# Patient Record
Sex: Female | Born: 1955 | Race: White | Hispanic: No | Marital: Married | Smoking: Never smoker
Health system: Southern US, Community
[De-identification: ages and names within clinical notes are randomized; demographics above are authoritative.]

## PROBLEM LIST (undated history)

## (undated) DIAGNOSIS — C801 Malignant (primary) neoplasm, unspecified: Secondary | ICD-10-CM

## (undated) DIAGNOSIS — E079 Disorder of thyroid, unspecified: Secondary | ICD-10-CM

## (undated) DIAGNOSIS — C50912 Malignant neoplasm of unspecified site of left female breast: Secondary | ICD-10-CM

## (undated) DIAGNOSIS — F419 Anxiety disorder, unspecified: Secondary | ICD-10-CM

## (undated) HISTORY — DX: Disorder of thyroid, unspecified: E07.9

## (undated) HISTORY — DX: Anxiety disorder, unspecified: F41.9

## (undated) HISTORY — DX: Malignant neoplasm of unspecified site of left female breast: C50.912

## (undated) HISTORY — PX: TONSILLECTOMY: SUR1361

---

## 2008-11-21 ENCOUNTER — Encounter: Admission: RE | Admit: 2008-11-21 | Discharge: 2008-11-21 | Payer: Self-pay | Admitting: Gynecology

## 2008-11-21 ENCOUNTER — Encounter (INDEPENDENT_AMBULATORY_CARE_PROVIDER_SITE_OTHER): Payer: Self-pay | Admitting: Diagnostic Radiology

## 2008-11-21 ENCOUNTER — Encounter (INDEPENDENT_AMBULATORY_CARE_PROVIDER_SITE_OTHER): Payer: Self-pay | Admitting: Gynecology

## 2008-11-27 ENCOUNTER — Encounter: Admission: RE | Admit: 2008-11-27 | Discharge: 2008-11-27 | Payer: Self-pay | Admitting: Gynecology

## 2008-11-28 ENCOUNTER — Ambulatory Visit: Payer: Self-pay | Admitting: Oncology

## 2008-11-29 LAB — CBC WITH DIFFERENTIAL/PLATELET
Basophils Absolute: 0 10*3/uL (ref 0.0–0.1)
Eosinophils Absolute: 0.1 10*3/uL (ref 0.0–0.5)
HGB: 13.8 g/dL (ref 11.6–15.9)
MCV: 83.6 fL (ref 79.5–101.0)
MONO#: 0.4 10*3/uL (ref 0.1–0.9)
MONO%: 6.3 % (ref 0.0–14.0)
NEUT#: 3.7 10*3/uL (ref 1.5–6.5)
RBC: 4.91 10*6/uL (ref 3.70–5.45)
RDW: 14.2 % (ref 11.2–14.5)
WBC: 6.3 10*3/uL (ref 3.9–10.3)
lymph#: 2 10*3/uL (ref 0.9–3.3)

## 2008-11-29 LAB — COMPREHENSIVE METABOLIC PANEL
Albumin: 4.1 g/dL (ref 3.5–5.2)
Alkaline Phosphatase: 65 U/L (ref 39–117)
BUN: 10 mg/dL (ref 6–23)
Calcium: 9.5 mg/dL (ref 8.4–10.5)
Chloride: 108 mEq/L (ref 96–112)
Glucose, Bld: 91 mg/dL (ref 70–99)
Potassium: 4.2 mEq/L (ref 3.5–5.3)
Sodium: 140 mEq/L (ref 135–145)
Total Protein: 7 g/dL (ref 6.0–8.3)

## 2008-11-30 LAB — CANCER ANTIGEN 27.29: CA 27.29: 19 U/mL (ref 0–39)

## 2008-12-05 ENCOUNTER — Ambulatory Visit (HOSPITAL_COMMUNITY): Admission: RE | Admit: 2008-12-05 | Discharge: 2008-12-05 | Payer: Self-pay | Admitting: Oncology

## 2008-12-06 ENCOUNTER — Ambulatory Visit (HOSPITAL_BASED_OUTPATIENT_CLINIC_OR_DEPARTMENT_OTHER): Admission: RE | Admit: 2008-12-06 | Discharge: 2008-12-06 | Payer: Self-pay | Admitting: Surgery

## 2008-12-06 ENCOUNTER — Encounter (INDEPENDENT_AMBULATORY_CARE_PROVIDER_SITE_OTHER): Payer: Self-pay | Admitting: Surgery

## 2008-12-08 ENCOUNTER — Encounter: Payer: Self-pay | Admitting: Oncology

## 2008-12-08 ENCOUNTER — Ambulatory Visit: Payer: Self-pay

## 2008-12-20 LAB — CBC WITH DIFFERENTIAL/PLATELET
BASO%: 0.3 % (ref 0.0–2.0)
Basophils Absolute: 0 10*3/uL (ref 0.0–0.1)
EOS%: 2.3 % (ref 0.0–7.0)
HGB: 12.7 g/dL (ref 11.6–15.9)
MCH: 28 pg (ref 25.1–34.0)
MCHC: 33.5 g/dL (ref 31.5–36.0)
MCV: 83.8 fL (ref 79.5–101.0)
MONO%: 4.1 % (ref 0.0–14.0)
RBC: 4.54 10*6/uL (ref 3.70–5.45)
RDW: 13.7 % (ref 11.2–14.5)
lymph#: 1.5 10*3/uL (ref 0.9–3.3)

## 2008-12-25 ENCOUNTER — Ambulatory Visit: Payer: Self-pay | Admitting: Oncology

## 2008-12-26 LAB — CBC WITH DIFFERENTIAL/PLATELET
Basophils Absolute: 0 10*3/uL (ref 0.0–0.1)
Eosinophils Absolute: 0.1 10*3/uL (ref 0.0–0.5)
HGB: 13.1 g/dL (ref 11.6–15.9)
MONO#: 0.9 10*3/uL (ref 0.1–0.9)
MONO%: 7.9 % (ref 0.0–14.0)
NEUT#: 7.5 10*3/uL — ABNORMAL HIGH (ref 1.5–6.5)
RBC: 4.78 10*6/uL (ref 3.70–5.45)
RDW: 13.6 % (ref 11.2–14.5)
WBC: 10.7 10*3/uL — ABNORMAL HIGH (ref 3.9–10.3)
lymph#: 2.3 10*3/uL (ref 0.9–3.3)
nRBC: 0 % (ref 0–0)

## 2009-01-02 LAB — CBC WITH DIFFERENTIAL/PLATELET
BASO%: 0.1 % (ref 0.0–2.0)
Eosinophils Absolute: 0 10*3/uL (ref 0.0–0.5)
HCT: 39.3 % (ref 34.8–46.6)
LYMPH%: 21.8 % (ref 14.0–49.7)
MCHC: 33.8 g/dL (ref 31.5–36.0)
MONO#: 0.2 10*3/uL (ref 0.1–0.9)
NEUT#: 6 10*3/uL (ref 1.5–6.5)
NEUT%: 74.8 % (ref 38.4–76.8)
Platelets: 175 10*3/uL (ref 145–400)
WBC: 8 10*3/uL (ref 3.9–10.3)
lymph#: 1.7 10*3/uL (ref 0.9–3.3)
nRBC: 0 % (ref 0–0)

## 2009-01-09 LAB — CBC WITH DIFFERENTIAL/PLATELET
Basophils Absolute: 0.1 10*3/uL (ref 0.0–0.1)
Eosinophils Absolute: 0.1 10*3/uL (ref 0.0–0.5)
HCT: 35.9 % (ref 34.8–46.6)
HGB: 12.1 g/dL (ref 11.6–15.9)
MCV: 81.2 fL (ref 79.5–101.0)
NEUT#: 7.5 10*3/uL — ABNORMAL HIGH (ref 1.5–6.5)
NEUT%: 68 % (ref 38.4–76.8)
RDW: 13.9 % (ref 11.2–14.5)
lymph#: 2.3 10*3/uL (ref 0.9–3.3)

## 2009-01-16 LAB — CBC WITH DIFFERENTIAL/PLATELET
Basophils Absolute: 0 10*3/uL (ref 0.0–0.1)
EOS%: 0.3 % (ref 0.0–7.0)
Eosinophils Absolute: 0 10*3/uL (ref 0.0–0.5)
HCT: 36.9 % (ref 34.8–46.6)
HGB: 12.5 g/dL (ref 11.6–15.9)
MCH: 27.5 pg (ref 25.1–34.0)
MONO#: 0.2 10*3/uL (ref 0.1–0.9)
NEUT#: 7.9 10*3/uL — ABNORMAL HIGH (ref 1.5–6.5)
NEUT%: 83.8 % — ABNORMAL HIGH (ref 38.4–76.8)
RDW: 13.6 % (ref 11.2–14.5)
lymph#: 1.3 10*3/uL (ref 0.9–3.3)

## 2009-01-23 ENCOUNTER — Ambulatory Visit: Payer: Self-pay | Admitting: Oncology

## 2009-01-23 LAB — CBC WITH DIFFERENTIAL/PLATELET
Basophils Absolute: 0 10*3/uL (ref 0.0–0.1)
EOS%: 1.1 % (ref 0.0–7.0)
Eosinophils Absolute: 0.1 10*3/uL (ref 0.0–0.5)
HCT: 34.6 % — ABNORMAL LOW (ref 34.8–46.6)
HGB: 11.7 g/dL (ref 11.6–15.9)
MCH: 27.7 pg (ref 25.1–34.0)
MCV: 81.8 fL (ref 79.5–101.0)
MONO%: 13 % (ref 0.0–14.0)
NEUT#: 5.9 10*3/uL (ref 1.5–6.5)
NEUT%: 64.9 % (ref 38.4–76.8)
Platelets: 248 10*3/uL (ref 145–400)
RDW: 14.7 % — ABNORMAL HIGH (ref 11.2–14.5)

## 2009-01-24 ENCOUNTER — Encounter: Payer: Self-pay | Admitting: Oncology

## 2009-01-24 ENCOUNTER — Ambulatory Visit (HOSPITAL_COMMUNITY): Admission: RE | Admit: 2009-01-24 | Discharge: 2009-01-24 | Payer: Self-pay | Admitting: Oncology

## 2009-01-24 ENCOUNTER — Ambulatory Visit: Payer: Self-pay | Admitting: Cardiology

## 2009-01-24 ENCOUNTER — Ambulatory Visit: Admission: RE | Admit: 2009-01-24 | Discharge: 2009-01-24 | Payer: Self-pay | Admitting: Oncology

## 2009-01-31 LAB — CBC WITH DIFFERENTIAL/PLATELET
Basophils Absolute: 0 10*3/uL (ref 0.0–0.1)
EOS%: 1.4 % (ref 0.0–7.0)
Eosinophils Absolute: 0.1 10*3/uL (ref 0.0–0.5)
LYMPH%: 22.6 % (ref 14.0–49.7)
MCH: 28.3 pg (ref 25.1–34.0)
MCV: 82.6 fL (ref 79.5–101.0)
MONO%: 9.1 % (ref 0.0–14.0)
NEUT#: 3.3 10*3/uL (ref 1.5–6.5)
Platelets: 252 10*3/uL (ref 145–400)
RBC: 4.16 10*6/uL (ref 3.70–5.45)

## 2009-01-31 LAB — COMPREHENSIVE METABOLIC PANEL
AST: 11 U/L (ref 0–37)
Alkaline Phosphatase: 94 U/L (ref 39–117)
BUN: 15 mg/dL (ref 6–23)
Glucose, Bld: 100 mg/dL — ABNORMAL HIGH (ref 70–99)
Sodium: 139 mEq/L (ref 135–145)
Total Bilirubin: 0.4 mg/dL (ref 0.3–1.2)

## 2009-02-06 LAB — CBC WITH DIFFERENTIAL/PLATELET
Basophils Absolute: 0 10*3/uL (ref 0.0–0.1)
Eosinophils Absolute: 0.1 10*3/uL (ref 0.0–0.5)
HGB: 12.3 g/dL (ref 11.6–15.9)
LYMPH%: 22.5 % (ref 14.0–49.7)
MCV: 83.2 fL (ref 79.5–101.0)
MONO%: 9 % (ref 0.0–14.0)
NEUT#: 6.2 10*3/uL (ref 1.5–6.5)
NEUT%: 66.8 % (ref 38.4–76.8)
Platelets: 263 10*3/uL (ref 145–400)

## 2009-02-06 LAB — TSH: TSH: 0.903 u[IU]/mL (ref 0.350–4.500)

## 2009-02-13 LAB — COMPREHENSIVE METABOLIC PANEL
ALT: 28 U/L (ref 0–35)
AST: 19 U/L (ref 0–37)
Albumin: 3.7 g/dL (ref 3.5–5.2)
CO2: 26 mEq/L (ref 19–32)
Calcium: 8.8 mg/dL (ref 8.4–10.5)
Chloride: 105 mEq/L (ref 96–112)
Potassium: 3.8 mEq/L (ref 3.5–5.3)
Sodium: 136 mEq/L (ref 135–145)
Total Protein: 6.3 g/dL (ref 6.0–8.3)

## 2009-02-13 LAB — CBC WITH DIFFERENTIAL/PLATELET
BASO%: 0.5 % (ref 0.0–2.0)
EOS%: 2.7 % (ref 0.0–7.0)
MCH: 28.1 pg (ref 25.1–34.0)
MCHC: 33.1 g/dL (ref 31.5–36.0)
MONO#: 0.4 10*3/uL (ref 0.1–0.9)
RBC: 4.13 10*6/uL (ref 3.70–5.45)
WBC: 6 10*3/uL (ref 3.9–10.3)
lymph#: 1.5 10*3/uL (ref 0.9–3.3)

## 2009-02-20 LAB — CBC WITH DIFFERENTIAL/PLATELET
BASO%: 0.4 % (ref 0.0–2.0)
Basophils Absolute: 0 10*3/uL (ref 0.0–0.1)
EOS%: 3.5 % (ref 0.0–7.0)
HCT: 31.2 % — ABNORMAL LOW (ref 34.8–46.6)
HGB: 10.3 g/dL — ABNORMAL LOW (ref 11.6–15.9)
MCH: 28.1 pg (ref 25.1–34.0)
MCHC: 33 g/dL (ref 31.5–36.0)
MONO#: 0.3 10*3/uL (ref 0.1–0.9)
NEUT%: 66.1 % (ref 38.4–76.8)
RDW: 16 % — ABNORMAL HIGH (ref 11.2–14.5)
WBC: 4.6 10*3/uL (ref 3.9–10.3)
lymph#: 1.1 10*3/uL (ref 0.9–3.3)

## 2009-02-23 ENCOUNTER — Ambulatory Visit: Payer: Self-pay | Admitting: Oncology

## 2009-02-27 LAB — CBC WITH DIFFERENTIAL/PLATELET
Basophils Absolute: 0 10*3/uL (ref 0.0–0.1)
Eosinophils Absolute: 0.3 10*3/uL (ref 0.0–0.5)
HGB: 10.2 g/dL — ABNORMAL LOW (ref 11.6–15.9)
MCV: 86.7 fL (ref 79.5–101.0)
MONO#: 0.3 10*3/uL (ref 0.1–0.9)
NEUT#: 3 10*3/uL (ref 1.5–6.5)
RBC: 3.68 10*6/uL — ABNORMAL LOW (ref 3.70–5.45)
RDW: 16.1 % — ABNORMAL HIGH (ref 11.2–14.5)
WBC: 4.7 10*3/uL (ref 3.9–10.3)

## 2009-03-06 LAB — CBC WITH DIFFERENTIAL/PLATELET
BASO%: 0.2 % (ref 0.0–2.0)
Eosinophils Absolute: 0.2 10*3/uL (ref 0.0–0.5)
HCT: 33.9 % — ABNORMAL LOW (ref 34.8–46.6)
LYMPH%: 23.4 % (ref 14.0–49.7)
MCHC: 32.4 g/dL (ref 31.5–36.0)
MONO#: 0.4 10*3/uL (ref 0.1–0.9)
NEUT#: 3.9 10*3/uL (ref 1.5–6.5)
Platelets: 258 10*3/uL (ref 145–400)
RBC: 3.84 10*6/uL (ref 3.70–5.45)
WBC: 5.8 10*3/uL (ref 3.9–10.3)
lymph#: 1.4 10*3/uL (ref 0.9–3.3)
nRBC: 0 % (ref 0–0)

## 2009-03-13 LAB — CBC WITH DIFFERENTIAL/PLATELET
BASO%: 0.4 % (ref 0.0–2.0)
EOS%: 3.2 % (ref 0.0–7.0)
HCT: 35 % (ref 34.8–46.6)
LYMPH%: 21 % (ref 14.0–49.7)
MCH: 28.8 pg (ref 25.1–34.0)
MCHC: 32.3 g/dL (ref 31.5–36.0)
MCV: 89.3 fL (ref 79.5–101.0)
MONO%: 5.1 % (ref 0.0–14.0)
NEUT%: 70.3 % (ref 38.4–76.8)
Platelets: 244 10*3/uL (ref 145–400)
RBC: 3.92 10*6/uL (ref 3.70–5.45)

## 2009-03-20 LAB — CBC WITH DIFFERENTIAL/PLATELET
BASO%: 0.4 % (ref 0.0–2.0)
EOS%: 3.1 % (ref 0.0–7.0)
HCT: 35.5 % (ref 34.8–46.6)
MCH: 29 pg (ref 25.1–34.0)
MCHC: 32.4 g/dL (ref 31.5–36.0)
NEUT%: 63.6 % (ref 38.4–76.8)
RBC: 3.96 10*6/uL (ref 3.70–5.45)
RDW: 15.4 % — ABNORMAL HIGH (ref 11.2–14.5)
WBC: 4.9 10*3/uL (ref 3.9–10.3)
lymph#: 1.3 10*3/uL (ref 0.9–3.3)
nRBC: 0 % (ref 0–0)

## 2009-03-23 ENCOUNTER — Ambulatory Visit: Payer: Self-pay | Admitting: Oncology

## 2009-03-27 LAB — COMPREHENSIVE METABOLIC PANEL
ALT: 33 U/L (ref 0–35)
Alkaline Phosphatase: 55 U/L (ref 39–117)
Potassium: 3.9 mEq/L (ref 3.5–5.3)
Sodium: 140 mEq/L (ref 135–145)
Total Bilirubin: 0.5 mg/dL (ref 0.3–1.2)
Total Protein: 6.6 g/dL (ref 6.0–8.3)

## 2009-03-27 LAB — CBC WITH DIFFERENTIAL/PLATELET
EOS%: 3.4 % (ref 0.0–7.0)
Eosinophils Absolute: 0.1 10*3/uL (ref 0.0–0.5)
MCV: 89.4 fL (ref 79.5–101.0)
MONO%: 6.1 % (ref 0.0–14.0)
NEUT#: 2.7 10*3/uL (ref 1.5–6.5)
RBC: 3.96 10*6/uL (ref 3.70–5.45)
RDW: 14.6 % — ABNORMAL HIGH (ref 11.2–14.5)
lymph#: 1 10*3/uL (ref 0.9–3.3)
nRBC: 0 % (ref 0–0)

## 2009-04-03 LAB — CBC WITH DIFFERENTIAL/PLATELET
BASO%: 0.2 % (ref 0.0–2.0)
EOS%: 2.3 % (ref 0.0–7.0)
LYMPH%: 21.5 % (ref 14.0–49.7)
MCH: 29.3 pg (ref 25.1–34.0)
MCHC: 32.8 g/dL (ref 31.5–36.0)
MCV: 89.3 fL (ref 79.5–101.0)
MONO%: 5.4 % (ref 0.0–14.0)
Platelets: 228 10*3/uL (ref 145–400)
RBC: 4.1 10*6/uL (ref 3.70–5.45)
nRBC: 0 % (ref 0–0)

## 2009-04-10 LAB — CBC WITH DIFFERENTIAL/PLATELET
BASO%: 0.2 % (ref 0.0–2.0)
EOS%: 2.6 % (ref 0.0–7.0)
HCT: 35.6 % (ref 34.8–46.6)
MCHC: 33.6 g/dL (ref 31.5–36.0)
MONO#: 0.2 10*3/uL (ref 0.1–0.9)
NEUT%: 74.9 % (ref 38.4–76.8)
RBC: 3.99 10*6/uL (ref 3.70–5.45)
RDW: 13.4 % (ref 11.2–14.5)
WBC: 4.5 10*3/uL (ref 3.9–10.3)
lymph#: 0.8 10*3/uL — ABNORMAL LOW (ref 0.9–3.3)

## 2009-04-17 LAB — CBC WITH DIFFERENTIAL/PLATELET
Basophils Absolute: 0 10*3/uL (ref 0.0–0.1)
HCT: 37.2 % (ref 34.8–46.6)
HGB: 12.1 g/dL (ref 11.6–15.9)
LYMPH%: 26.6 % (ref 14.0–49.7)
MCH: 28.9 pg (ref 25.1–34.0)
MONO#: 0.3 10*3/uL (ref 0.1–0.9)
NEUT%: 62.5 % (ref 38.4–76.8)
Platelets: 226 10*3/uL (ref 145–400)
WBC: 4.1 10*3/uL (ref 3.9–10.3)
lymph#: 1.1 10*3/uL (ref 0.9–3.3)

## 2009-04-17 LAB — COMPREHENSIVE METABOLIC PANEL
Albumin: 4.5 g/dL (ref 3.5–5.2)
BUN: 18 mg/dL (ref 6–23)
Calcium: 9.5 mg/dL (ref 8.4–10.5)
Chloride: 107 mEq/L (ref 96–112)
Glucose, Bld: 74 mg/dL (ref 70–99)
Potassium: 4.3 mEq/L (ref 3.5–5.3)

## 2009-04-19 ENCOUNTER — Encounter: Payer: Self-pay | Admitting: Oncology

## 2009-04-19 ENCOUNTER — Ambulatory Visit: Admission: RE | Admit: 2009-04-19 | Discharge: 2009-04-19 | Payer: Self-pay | Admitting: Oncology

## 2009-04-24 ENCOUNTER — Ambulatory Visit: Payer: Self-pay | Admitting: Oncology

## 2009-04-24 LAB — CBC WITH DIFFERENTIAL/PLATELET
Basophils Absolute: 0 10*3/uL (ref 0.0–0.1)
EOS%: 2.6 % (ref 0.0–7.0)
Eosinophils Absolute: 0.1 10*3/uL (ref 0.0–0.5)
HGB: 12.8 g/dL (ref 11.6–15.9)
MONO#: 0.3 10*3/uL (ref 0.1–0.9)
NEUT#: 2.7 10*3/uL (ref 1.5–6.5)
RDW: 12.7 % (ref 11.2–14.5)
WBC: 4.2 10*3/uL (ref 3.9–10.3)
lymph#: 1.1 10*3/uL (ref 0.9–3.3)

## 2009-04-30 ENCOUNTER — Ambulatory Visit (HOSPITAL_COMMUNITY): Admission: RE | Admit: 2009-04-30 | Discharge: 2009-04-30 | Payer: Self-pay | Admitting: Oncology

## 2009-05-01 LAB — COMPREHENSIVE METABOLIC PANEL
Albumin: 4.2 g/dL (ref 3.5–5.2)
Alkaline Phosphatase: 55 U/L (ref 39–117)
BUN: 15 mg/dL (ref 6–23)
Calcium: 9.4 mg/dL (ref 8.4–10.5)
Chloride: 103 mEq/L (ref 96–112)
Glucose, Bld: 84 mg/dL (ref 70–99)
Potassium: 4.2 mEq/L (ref 3.5–5.3)

## 2009-05-01 LAB — CBC WITH DIFFERENTIAL/PLATELET
Basophils Absolute: 0 10*3/uL (ref 0.0–0.1)
Eosinophils Absolute: 0.2 10*3/uL (ref 0.0–0.5)
HGB: 13 g/dL (ref 11.6–15.9)
MCV: 88.1 fL (ref 79.5–101.0)
MONO%: 8.2 % (ref 0.0–14.0)
NEUT#: 3.2 10*3/uL (ref 1.5–6.5)
RDW: 12.4 % (ref 11.2–14.5)

## 2009-05-12 HISTORY — PX: MASTECTOMY: SHX3

## 2009-05-18 ENCOUNTER — Ambulatory Visit: Payer: Self-pay | Admitting: Oncology

## 2009-05-22 ENCOUNTER — Ambulatory Visit: Admission: RE | Admit: 2009-05-22 | Discharge: 2009-07-25 | Payer: Self-pay | Admitting: Radiation Oncology

## 2009-05-22 LAB — COMPREHENSIVE METABOLIC PANEL
CO2: 26 mEq/L (ref 19–32)
Creatinine, Ser: 0.52 mg/dL (ref 0.40–1.20)
Glucose, Bld: 106 mg/dL — ABNORMAL HIGH (ref 70–99)
Total Bilirubin: 0.6 mg/dL (ref 0.3–1.2)
Total Protein: 6.7 g/dL (ref 6.0–8.3)

## 2009-05-22 LAB — CBC WITH DIFFERENTIAL/PLATELET
Basophils Absolute: 0 10*3/uL (ref 0.0–0.1)
Eosinophils Absolute: 0.2 10*3/uL (ref 0.0–0.5)
HCT: 42.9 % (ref 34.8–46.6)
LYMPH%: 25.9 % (ref 14.0–49.7)
MONO#: 0.4 10*3/uL (ref 0.1–0.9)
NEUT#: 3.1 10*3/uL (ref 1.5–6.5)
NEUT%: 60.7 % (ref 38.4–76.8)
Platelets: 210 10*3/uL (ref 145–400)
WBC: 5 10*3/uL (ref 3.9–10.3)

## 2009-06-07 ENCOUNTER — Encounter (INDEPENDENT_AMBULATORY_CARE_PROVIDER_SITE_OTHER): Payer: Self-pay | Admitting: Surgery

## 2009-06-07 ENCOUNTER — Inpatient Hospital Stay (HOSPITAL_COMMUNITY): Admission: RE | Admit: 2009-06-07 | Discharge: 2009-06-09 | Payer: Self-pay | Admitting: Surgery

## 2009-06-19 ENCOUNTER — Ambulatory Visit: Payer: Self-pay | Admitting: Oncology

## 2009-07-03 LAB — CBC WITH DIFFERENTIAL/PLATELET
Basophils Absolute: 0 10*3/uL (ref 0.0–0.1)
Eosinophils Absolute: 0.2 10*3/uL (ref 0.0–0.5)
HCT: 41.1 % (ref 34.8–46.6)
HGB: 13.4 g/dL (ref 11.6–15.9)
LYMPH%: 28.2 % (ref 14.0–49.7)
MCV: 83.4 fL (ref 79.5–101.0)
MONO%: 4.8 % (ref 0.0–14.0)
NEUT#: 3.4 10*3/uL (ref 1.5–6.5)
NEUT%: 63.6 % (ref 38.4–76.8)
Platelets: 247 10*3/uL (ref 145–400)
RDW: 12.5 % (ref 11.2–14.5)

## 2009-07-17 ENCOUNTER — Ambulatory Visit: Admission: RE | Admit: 2009-07-17 | Discharge: 2009-07-17 | Payer: Self-pay | Admitting: Oncology

## 2009-07-17 ENCOUNTER — Encounter: Payer: Self-pay | Admitting: Oncology

## 2009-07-20 ENCOUNTER — Ambulatory Visit: Payer: Self-pay | Admitting: Oncology

## 2009-07-24 LAB — COMPREHENSIVE METABOLIC PANEL
Albumin: 4.2 g/dL (ref 3.5–5.2)
BUN: 15 mg/dL (ref 6–23)
Calcium: 9.2 mg/dL (ref 8.4–10.5)
Chloride: 103 mEq/L (ref 96–112)
Creatinine, Ser: 0.63 mg/dL (ref 0.40–1.20)
Glucose, Bld: 124 mg/dL — ABNORMAL HIGH (ref 70–99)
Potassium: 4.1 mEq/L (ref 3.5–5.3)

## 2009-07-24 LAB — CBC WITH DIFFERENTIAL/PLATELET
Basophils Absolute: 0 10*3/uL (ref 0.0–0.1)
Eosinophils Absolute: 0.2 10*3/uL (ref 0.0–0.5)
HCT: 41.3 % (ref 34.8–46.6)
HGB: 13.5 g/dL (ref 11.6–15.9)
MONO#: 0.3 10*3/uL (ref 0.1–0.9)
NEUT#: 2.6 10*3/uL (ref 1.5–6.5)
RDW: 13.5 % (ref 11.2–14.5)
lymph#: 1.4 10*3/uL (ref 0.9–3.3)

## 2009-07-30 ENCOUNTER — Encounter: Admission: RE | Admit: 2009-07-30 | Discharge: 2009-08-15 | Payer: Self-pay | Admitting: Oncology

## 2009-08-14 ENCOUNTER — Ambulatory Visit: Admission: RE | Admit: 2009-08-14 | Discharge: 2009-10-12 | Payer: Self-pay | Admitting: Radiation Oncology

## 2009-08-14 LAB — CBC WITH DIFFERENTIAL/PLATELET
BASO%: 0.2 % (ref 0.0–2.0)
EOS%: 2.5 % (ref 0.0–7.0)
HGB: 13.3 g/dL (ref 11.6–15.9)
MCH: 27.3 pg (ref 25.1–34.0)
MCHC: 32.9 g/dL (ref 31.5–36.0)
MCV: 82.8 fL (ref 79.5–101.0)
MONO%: 5.3 % (ref 0.0–14.0)
RBC: 4.88 10*6/uL (ref 3.70–5.45)
RDW: 14.3 % (ref 11.2–14.5)
lymph#: 1.3 10*3/uL (ref 0.9–3.3)

## 2009-08-14 LAB — FOLLICLE STIMULATING HORMONE: FSH: 37.6 m[IU]/mL

## 2009-08-22 LAB — ESTRADIOL, ULTRA SENS: Estradiol, Ultra Sensitive: <2 pg/mL

## 2009-08-24 ENCOUNTER — Ambulatory Visit: Payer: Self-pay | Admitting: Oncology

## 2009-08-28 LAB — CBC WITH DIFFERENTIAL/PLATELET
BASO%: 0.4 % (ref 0.0–2.0)
Basophils Absolute: 0 10*3/uL (ref 0.0–0.1)
EOS%: 2.2 % (ref 0.0–7.0)
HGB: 13.7 g/dL (ref 11.6–15.9)
MCH: 28.2 pg (ref 25.1–34.0)
MCHC: 33.7 g/dL (ref 31.5–36.0)
MCV: 83.6 fL (ref 79.5–101.0)
MONO%: 6.3 % (ref 0.0–14.0)
RBC: 4.84 10*6/uL (ref 3.70–5.45)
RDW: 14.8 % — ABNORMAL HIGH (ref 11.2–14.5)
lymph#: 1.3 10*3/uL (ref 0.9–3.3)

## 2009-08-29 LAB — COMPREHENSIVE METABOLIC PANEL
ALT: 30 U/L (ref 0–35)
AST: 17 U/L (ref 0–37)
Albumin: 4.3 g/dL (ref 3.5–5.2)
Alkaline Phosphatase: 81 U/L (ref 39–117)
BUN: 13 mg/dL (ref 6–23)
Chloride: 105 mEq/L (ref 96–112)
Creatinine, Ser: 0.59 mg/dL (ref 0.40–1.20)
Potassium: 4.2 mEq/L (ref 3.5–5.3)

## 2009-08-29 LAB — CANCER ANTIGEN 27.29: CA 27.29: 16 U/mL (ref 0–39)

## 2009-09-24 ENCOUNTER — Ambulatory Visit: Payer: Self-pay | Admitting: Oncology

## 2009-09-25 LAB — CBC WITH DIFFERENTIAL/PLATELET
Basophils Absolute: 0 10*3/uL (ref 0.0–0.1)
EOS%: 2.4 % (ref 0.0–7.0)
HGB: 14.1 g/dL (ref 11.6–15.9)
MCH: 27.8 pg (ref 25.1–34.0)
MCV: 83.2 fL (ref 79.5–101.0)
MONO%: 9 % (ref 0.0–14.0)
NEUT#: 2.2 10*3/uL (ref 1.5–6.5)
RBC: 5.07 10*6/uL (ref 3.70–5.45)
RDW: 14.3 % (ref 11.2–14.5)
lymph#: 0.7 10*3/uL — ABNORMAL LOW (ref 0.9–3.3)

## 2009-09-25 LAB — FOLLICLE STIMULATING HORMONE: FSH: 35.3 m[IU]/mL

## 2009-10-04 LAB — ESTRADIOL, ULTRA SENS: Estradiol, Ultra Sensitive: 11 pg/mL

## 2009-10-16 ENCOUNTER — Encounter: Payer: Self-pay | Admitting: Oncology

## 2009-10-16 ENCOUNTER — Ambulatory Visit: Admission: RE | Admit: 2009-10-16 | Discharge: 2009-10-16 | Payer: Self-pay | Admitting: Oncology

## 2009-10-16 LAB — CBC WITH DIFFERENTIAL/PLATELET
BASO%: 1.1 % (ref 0.0–2.0)
Basophils Absolute: 0.1 10*3/uL (ref 0.0–0.1)
Eosinophils Absolute: 0.1 10*3/uL (ref 0.0–0.5)
HCT: 40.7 % (ref 34.8–46.6)
HGB: 13.8 g/dL (ref 11.6–15.9)
LYMPH%: 20.3 % (ref 14.0–49.7)
MCHC: 33.9 g/dL (ref 31.5–36.0)
MONO#: 0.3 10*3/uL (ref 0.1–0.9)
NEUT%: 69.9 % (ref 38.4–76.8)
Platelets: 190 10*3/uL (ref 145–400)
WBC: 4.7 10*3/uL (ref 3.9–10.3)

## 2009-10-16 LAB — COMPREHENSIVE METABOLIC PANEL
Albumin: 4.2 g/dL (ref 3.5–5.2)
BUN: 14 mg/dL (ref 6–23)
CO2: 20 mEq/L (ref 19–32)
Calcium: 9.2 mg/dL (ref 8.4–10.5)
Chloride: 104 mEq/L (ref 96–112)
Creatinine, Ser: 0.63 mg/dL (ref 0.40–1.20)
Glucose, Bld: 92 mg/dL (ref 70–99)

## 2009-10-16 LAB — CANCER ANTIGEN 27.29: CA 27.29: 10 U/mL (ref 0–39)

## 2009-10-16 LAB — FOLLICLE STIMULATING HORMONE: FSH: 38.9 m[IU]/mL

## 2009-10-26 LAB — ESTRADIOL, ULTRA SENS: Estradiol, Ultra Sensitive: <2 pg/mL

## 2009-11-06 ENCOUNTER — Ambulatory Visit: Payer: Self-pay | Admitting: Oncology

## 2009-11-06 LAB — CBC WITH DIFFERENTIAL/PLATELET
BASO%: 0.3 % (ref 0.0–2.0)
EOS%: 2.7 % (ref 0.0–7.0)
MCH: 28 pg (ref 25.1–34.0)
MCHC: 33.5 g/dL (ref 31.5–36.0)
MCV: 83.5 fL (ref 79.5–101.0)
MONO%: 12.6 % (ref 0.0–14.0)
NEUT%: 58.9 % (ref 38.4–76.8)
RDW: 13.4 % (ref 11.2–14.5)
lymph#: 0.9 10*3/uL (ref 0.9–3.3)

## 2009-12-14 ENCOUNTER — Ambulatory Visit: Payer: Self-pay | Admitting: Oncology

## 2009-12-17 ENCOUNTER — Ambulatory Visit: Payer: Self-pay

## 2009-12-17 ENCOUNTER — Ambulatory Visit: Payer: Self-pay | Admitting: Internal Medicine

## 2009-12-17 ENCOUNTER — Ambulatory Visit (HOSPITAL_COMMUNITY): Admission: RE | Admit: 2009-12-17 | Discharge: 2009-12-17 | Payer: Self-pay | Admitting: Oncology

## 2009-12-17 ENCOUNTER — Encounter: Payer: Self-pay | Admitting: Oncology

## 2009-12-18 ENCOUNTER — Ambulatory Visit (HOSPITAL_COMMUNITY): Admission: RE | Admit: 2009-12-18 | Discharge: 2009-12-18 | Payer: Self-pay | Admitting: Oncology

## 2009-12-18 LAB — CBC WITH DIFFERENTIAL/PLATELET
Eosinophils Absolute: 0.2 10*3/uL (ref 0.0–0.5)
HCT: 43.6 % (ref 34.8–46.6)
LYMPH%: 23.5 % (ref 14.0–49.7)
MONO#: 0.3 10*3/uL (ref 0.1–0.9)
NEUT#: 2.8 10*3/uL (ref 1.5–6.5)
Platelets: 206 10*3/uL (ref 145–400)
RBC: 5.18 10*6/uL (ref 3.70–5.45)
WBC: 4.3 10*3/uL (ref 3.9–10.3)

## 2010-02-04 ENCOUNTER — Ambulatory Visit (HOSPITAL_BASED_OUTPATIENT_CLINIC_OR_DEPARTMENT_OTHER): Admission: RE | Admit: 2010-02-04 | Discharge: 2010-02-04 | Payer: Self-pay | Admitting: Surgery

## 2010-03-28 ENCOUNTER — Ambulatory Visit: Payer: Self-pay | Admitting: Oncology

## 2010-04-01 ENCOUNTER — Ambulatory Visit (HOSPITAL_COMMUNITY)
Admission: RE | Admit: 2010-04-01 | Discharge: 2010-04-01 | Payer: Self-pay | Source: Home / Self Care | Admitting: Oncology

## 2010-04-01 LAB — COMPREHENSIVE METABOLIC PANEL
AST: 14 U/L (ref 0–37)
Albumin: 4.4 g/dL (ref 3.5–5.2)
Alkaline Phosphatase: 86 U/L (ref 39–117)
BUN: 12 mg/dL (ref 6–23)
Creatinine, Ser: 0.65 mg/dL (ref 0.40–1.20)
Potassium: 3.9 mEq/L (ref 3.5–5.3)

## 2010-04-01 LAB — CBC WITH DIFFERENTIAL/PLATELET
Eosinophils Absolute: 0.1 10*3/uL (ref 0.0–0.5)
LYMPH%: 27.7 % (ref 14.0–49.7)
MCV: 84.4 fL (ref 79.5–101.0)
MONO%: 5.6 % (ref 0.0–14.0)
NEUT#: 2.8 10*3/uL (ref 1.5–6.5)
Platelets: 242 10*3/uL (ref 145–400)
RBC: 4.9 10*6/uL (ref 3.70–5.45)

## 2010-04-07 LAB — ESTRADIOL, ULTRA SENS: Estradiol, Ultra Sensitive: 10 pg/mL

## 2010-07-28 LAB — BASIC METABOLIC PANEL
GFR calc non Af Amer: >60 mL/min (ref >60)
Potassium: 4.7 mEq/L (ref 3.5–5.1)
Sodium: 137 mEq/L (ref 135–145)

## 2010-07-28 LAB — CBC
HCT: 39.9 % (ref 36.0–46.0)
Hemoglobin: 13.6 g/dL (ref 12.0–15.0)
RBC: 4.69 MIL/uL (ref 3.87–5.11)
WBC: 4.8 10*3/uL (ref 4.0–10.5)

## 2010-07-28 LAB — URINE MICROSCOPIC-ADD ON

## 2010-07-28 LAB — DIFFERENTIAL
Eosinophils Relative: 4 % (ref 0–5)
Lymphocytes Relative: 25 % (ref 12–46)
Lymphs Abs: 1.2 10*3/uL (ref 0.7–4.0)
Monocytes Absolute: 0.5 10*3/uL (ref 0.1–1.0)

## 2010-07-28 LAB — URINALYSIS, ROUTINE W REFLEX MICROSCOPIC
Glucose, UA: NEGATIVE mg/dL
Hgb urine dipstick: NEGATIVE
Protein, ur: NEGATIVE mg/dL
pH: 5 (ref 5.0–8.0)

## 2010-08-08 ENCOUNTER — Ambulatory Visit: Payer: BC Managed Care – PPO | Attending: Radiation Oncology | Admitting: Radiation Oncology

## 2010-08-10 IMAGING — CR DG CHEST 2V
2 series · 2 of 2 positions shown · non-contrast
Comparison: Portable chest x-ray of 12/06/2008

CLINICAL DATA: History of breast carcinoma

CHEST - 2 VIEW

[w chest pa *]
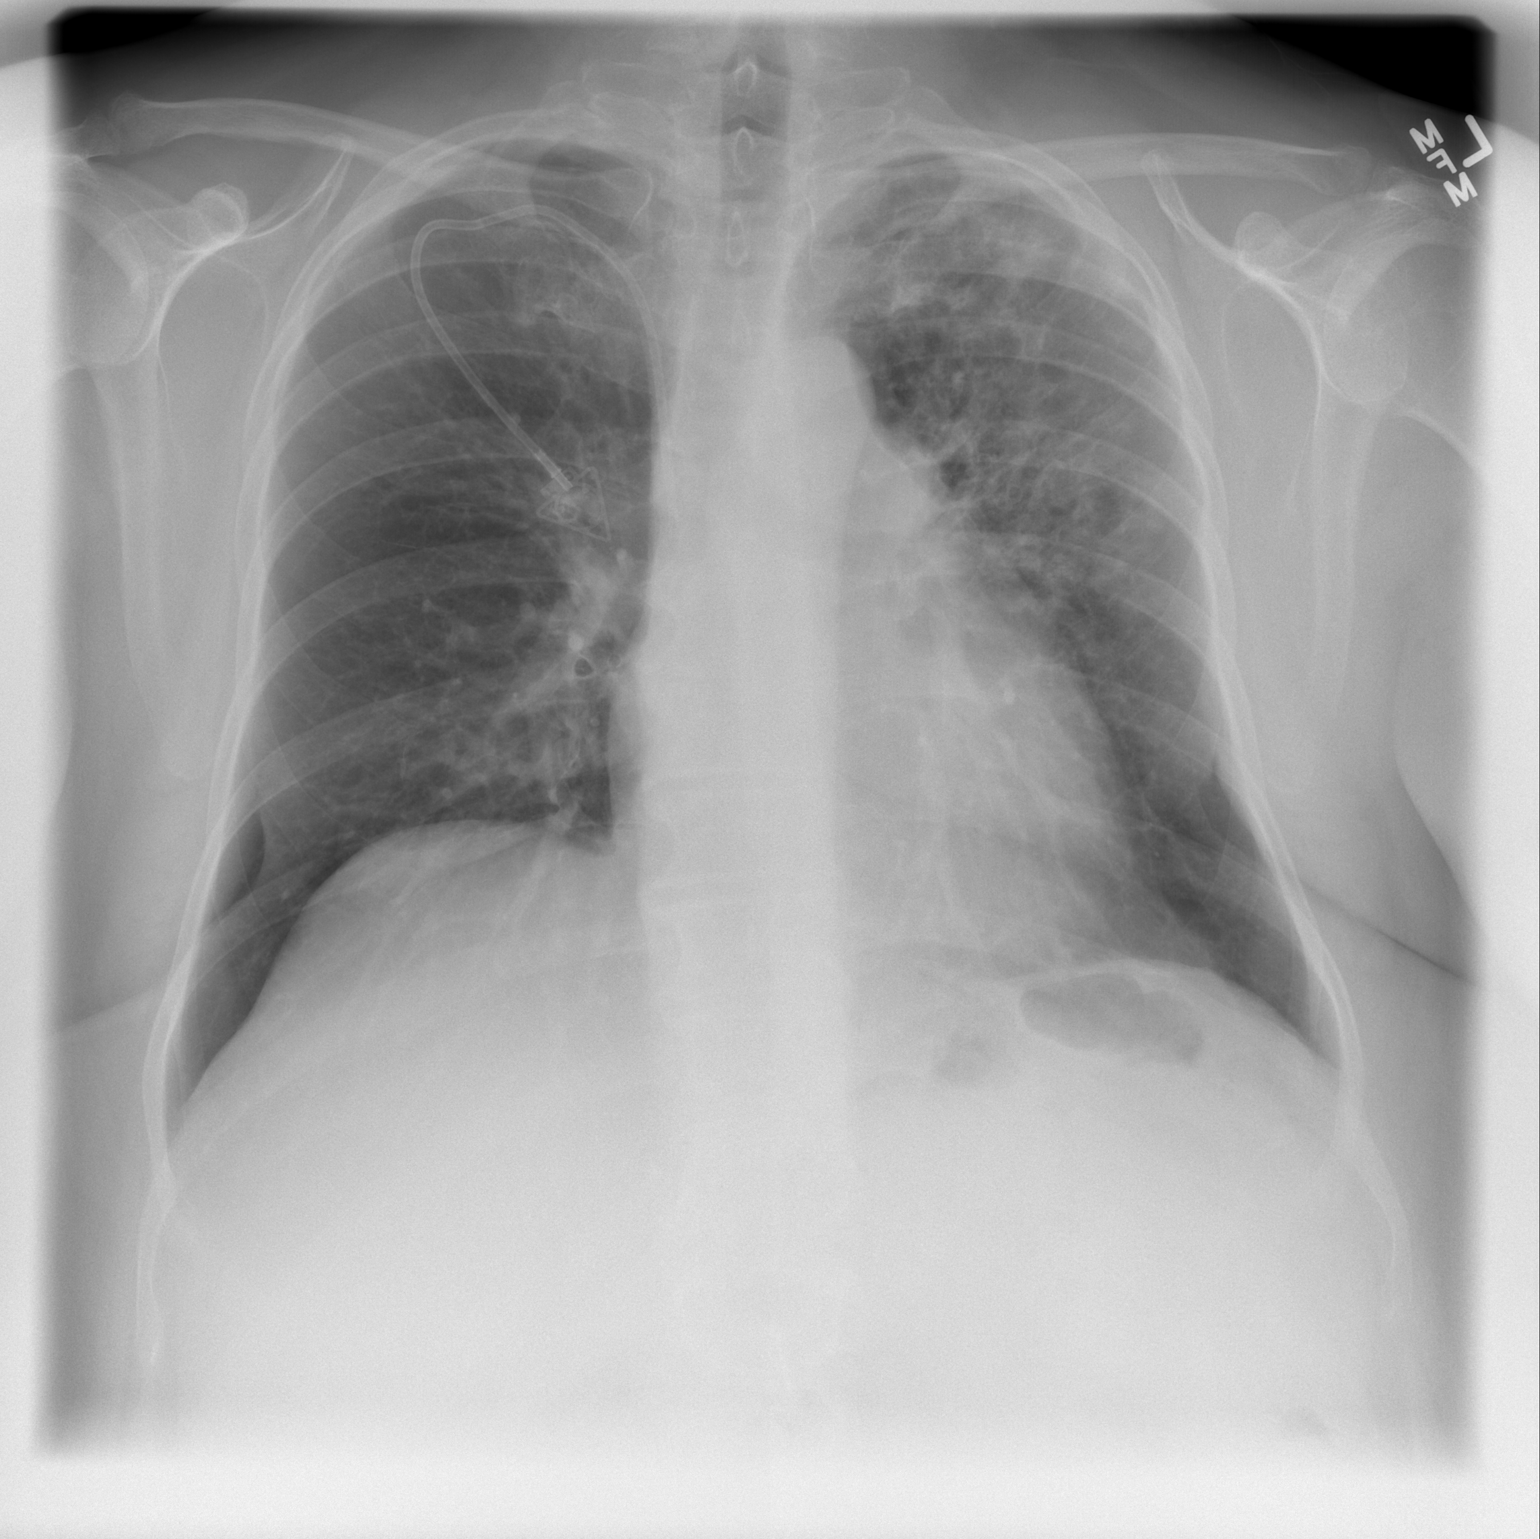

[w chest lat]
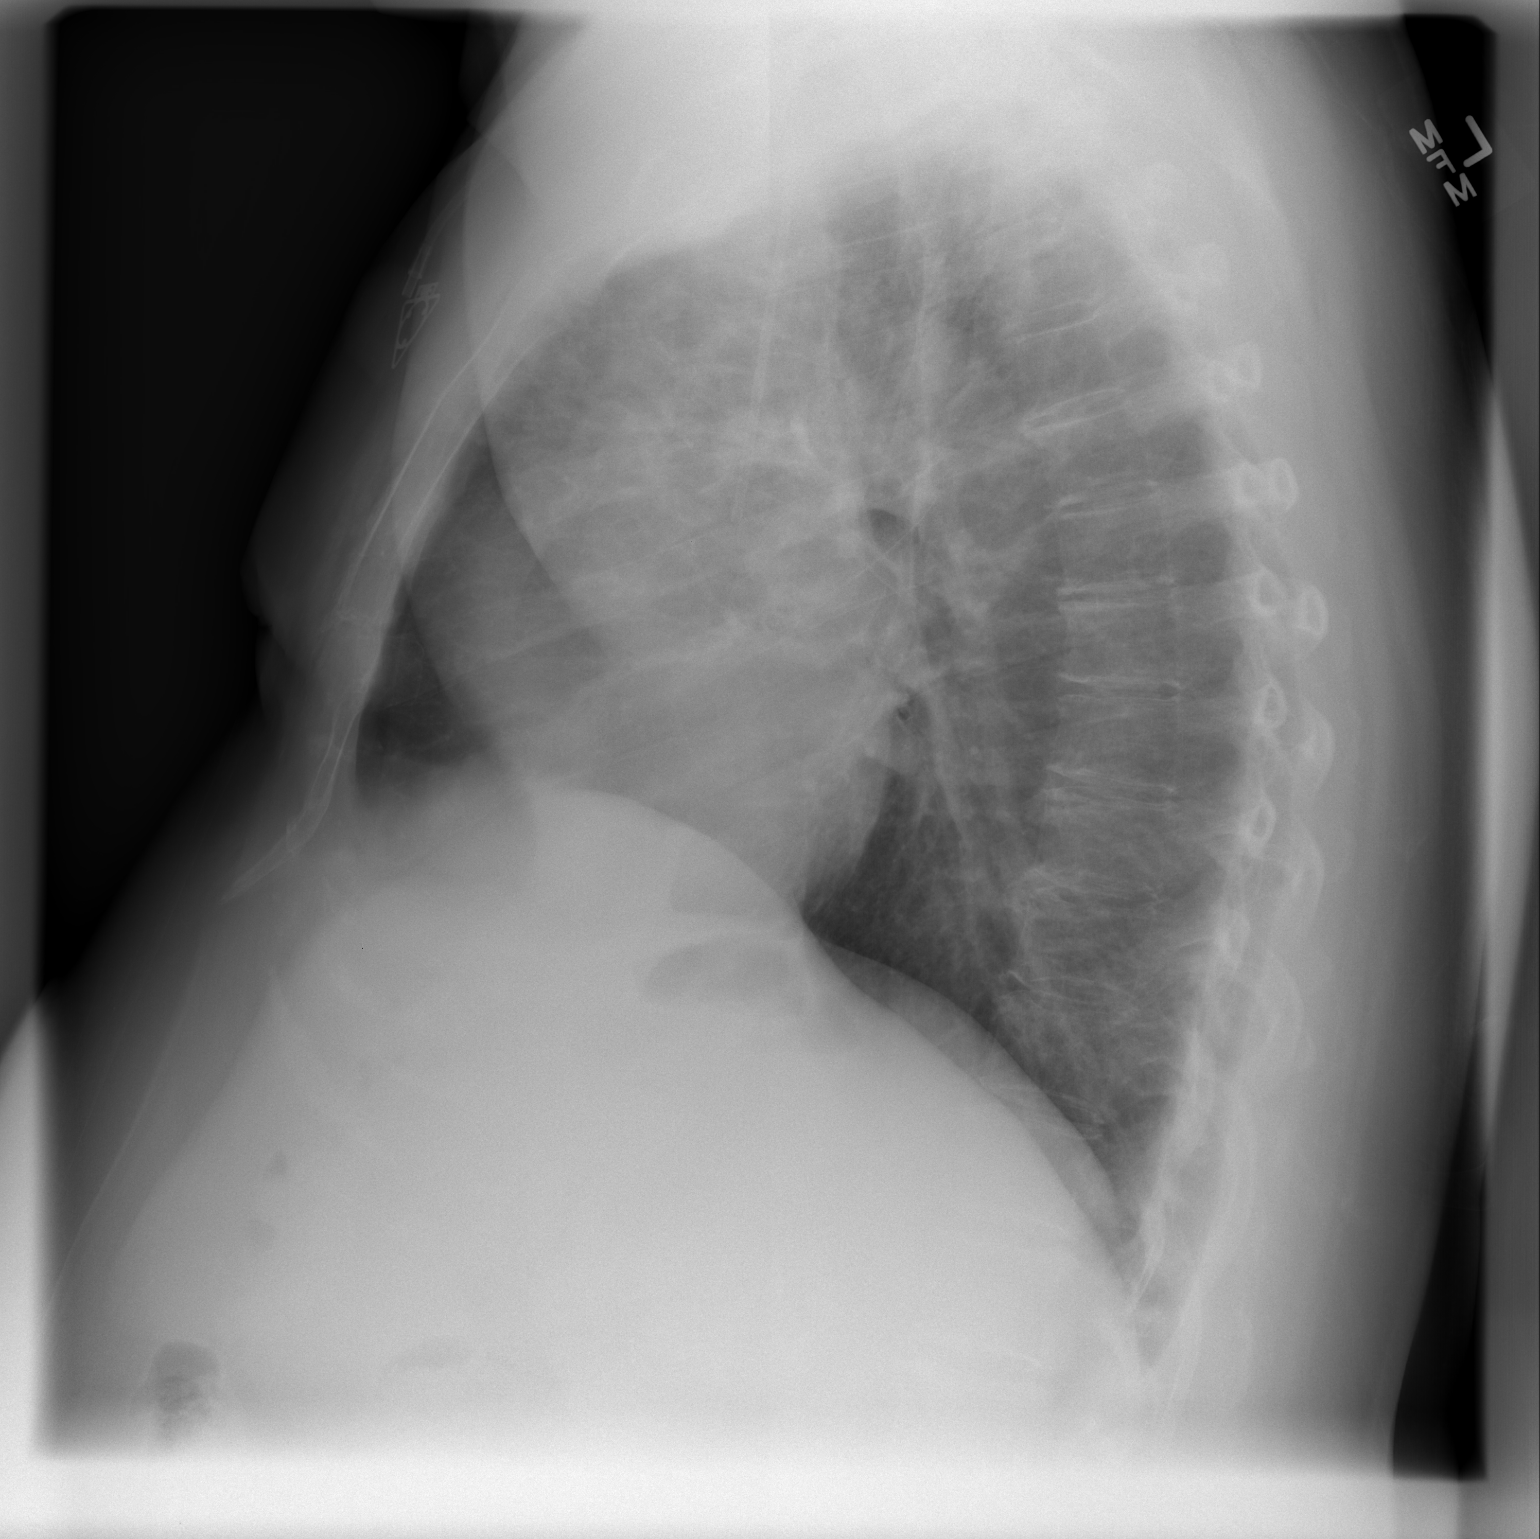

[2 of 2 positions shown; findings below may reference images not displayed]

FINDINGS: There is new opacity within the left perihilar region and
left upper lobe.  Some this opacity may be due to radiation
fibrosis, but pneumonia cannot be excluded and clinical correlation
is recommended.  The right lung is clear.  Port-A-Cath present with
the tip in the mid SVC.  The heart is within upper limits normal in
size.
IMPRESSION: Patchy opacity in the left perihilar region and left upper lobe.
Possibly due to radiation fibrosis but cannot exclude pneumonia.
Correlate clinically.

## 2010-08-18 LAB — GLUCOSE, CAPILLARY: Glucose-Capillary: 106 mg/dL — ABNORMAL HIGH (ref 70–99)

## 2010-09-24 NOTE — Op Note (Signed)
NAMEJAMIEKA, Michele Davis          ACCOUNT NO.:  [id number]   MEDICAL RECORD NO.:  [id number]          PATIENT TYPE:  AMB   LOCATION:  DSC                          FACILITY:  [facility]   PHYSICIAN:  Currie Paris, M.D.DATE OF BIRTH:  Dec 08, 1955   DATE OF PROCEDURE:  12/06/2008  DATE OF DISCHARGE:                               OPERATIVE REPORT   OFFICE MEDICAL RECORD NUMBER:  [facility] [phone]   PREOPERATIVE DIAGNOSIS:  Carcinoma, left breast, clinical stage II  possibly III with skin nodule, possible skin metastasis.   POSTOPERATIVE DIAGNOSIS:  Carcinoma, left breast, clinical stage II  possibly III with skin nodule, possible skin metastasis.   PROCEDURE:  Port-A-Cath placement with biopsy of left breast skin  nodule.   SURGEON:  Currie Paris, MD   ANESTHESIA:  General.   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  Although, there  were no clear-cut inflammatory changes of the skin, there were some  small skin nodules medially that were worrisome for skin involvement.   She was seen by the oncologist and consultation for preoperative  neoadjuvant therapy and agreed to proceed to neoadjuvant therapy.  We  planned to put a Port-A-Cath in and do skin biopsy to evaluate the  possibility of metastatic skin disease.   DESCRIPTION OF PROCEDURE:  I saw the patient in the preop area and we  reviewed the plans for the procedure.  I marked one of the skin nodules  in the left breast medially located.   The patient was taken to the operating room after satisfactory general  anesthesia had been obtained.  Both breasts and upper neck were prepped  and draped as a sterile field.  The time-out was done.   I started by placing the port.  I was able to enter the subclavian vein  on the initial attempt and thread the guidewire.  I could confirm the  position with the fluoro was in the superior vena cava.   I made a skin incision on the anterior chest fairly medially as the  patient has large breasts and I wanted to stay out of the breast tissue  and have a good place to anchor the port.  The pocket was fashioned.  The Port-A-Cath tubing was brought through a subcutaneous tunnel.  I  then attempted to dilate the tract of the guidewire, but unfortunately  there was a fairly tight band of tissue beneath the clavicle, and I was  unable to get the dilator to go through this with normal amount of  effort, and I did not wish to tear anything, so after couple of  attempts, I abandoned that.  I removed the guidewire and waited to make  sure there is no bleeding.   I then made another attempt to enter the subclavian vein at this time a  little bit more laterally and was able again to thread the guidewire and  confirm positioning.  At this time, the dilator went through fairly  readily and I advanced it under fluoroscopic control making sure it was  in the  superior vena cava.  The guidewire and dilator were removed and  the catheter threaded into about 20 cm.  I used a little contrast to get  visualization of the catheter and could see that was in the right atrial  area.  Peel-away sheath was removed and the catheter backed up under  fluoroscopic control to about 16 cm where it appeared to be in the  distal superior vena cava.  It aspirated and flushed easily.   The reservoir was flushed, attached and a locking mechanism engaged.  It  is flushed easily as well.   I then sutured the reservoir to the fascia using 2-0 Prolene sutures.  It aspirated and flushed easily and then finally was flushed with  concentrated heparin.   The incision was closed with 3-0 Vicryl, 4-0 Monocryl subcuticular, and  Dermabond.   Upon completion of that, I turned my attention to the left breast, which  had already been prepped out.  I made a short elliptical incision in one  of the nodules involving the skin and got  into the subcutaneous tissue,  so I thought I had a good sample of tissue here.  This was sent to  pathology.  Incision was closed with 4-0 Monocryl subcuticular.   The patient tolerated the procedure well and there were no  complications.  Chest x-ray was pending at the time of the dictation.      Currie Paris, M.D.  Electronically Signed     CJS/MEDQ  D:  12/06/2008  T:  12/07/2008  Job:  [id number]

## 2010-09-30 ENCOUNTER — Other Ambulatory Visit: Payer: Self-pay | Admitting: Oncology

## 2010-09-30 ENCOUNTER — Encounter (HOSPITAL_BASED_OUTPATIENT_CLINIC_OR_DEPARTMENT_OTHER): Payer: BC Managed Care – PPO | Admitting: Oncology

## 2010-09-30 DIAGNOSIS — C50919 Malignant neoplasm of unspecified site of unspecified female breast: Secondary | ICD-10-CM

## 2010-09-30 DIAGNOSIS — Z170 Estrogen receptor positive status [ER+]: Secondary | ICD-10-CM

## 2010-09-30 LAB — COMPREHENSIVE METABOLIC PANEL
AST: 14 U/L (ref 0–37)
Alkaline Phosphatase: 77 U/L (ref 39–117)
Glucose, Bld: 91 mg/dL (ref 70–99)
Potassium: 4 mEq/L (ref 3.5–5.3)
Sodium: 137 mEq/L (ref 135–145)
Total Bilirubin: 0.6 mg/dL (ref 0.3–1.2)
Total Protein: 6.9 g/dL (ref 6.0–8.3)

## 2010-09-30 LAB — CBC WITH DIFFERENTIAL/PLATELET
EOS%: 2.1 % (ref 0.0–7.0)
Eosinophils Absolute: 0.1 10*3/uL (ref 0.0–0.5)
LYMPH%: 28.7 % (ref 14.0–49.7)
MCH: 28.5 pg (ref 25.1–34.0)
MCHC: 33.5 g/dL (ref 31.5–36.0)
MCV: 84.8 fL (ref 79.5–101.0)
MONO%: 7.9 % (ref 0.0–14.0)
Platelets: 218 10*3/uL (ref 145–400)
RBC: 4.67 10*6/uL (ref 3.70–5.45)
RDW: 13.3 % (ref 11.2–14.5)

## 2010-09-30 LAB — VITAMIN D 25 HYDROXY (VIT D DEFICIENCY, FRACTURES): Vit D, 25-Hydroxy: 23 ng/mL — ABNORMAL LOW (ref 30–89)

## 2010-11-22 IMAGING — NM NM CARDIA MUGA REST
6 series · 36 of 36 positions shown · non-contrast
Comparison: None.

CLINICAL DATA: Breast cancer

NUCLEAR MEDICINE CARDIAC MULTIPLE UPTAKE GATED ACQUISITION SCAN
TECHNIQUE: Radiolabeled red blood cells used to perform resting
radionuclide ventriculography. Imaging performed in the anterior,
LAO, and lateral projections.Resting left ventricular ejection
fraction estimated after drawing region of interest curves around
the left ventricle during systole and diastole.
Radiopharmaceutical: 24 mCi technetium labeled RBCs

[Series 1: mu muga · 4.30mm/px · 6 of 16 frames shown (1 of 6)]
[frame 2/16]
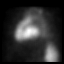
[frame 4/16]
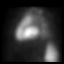
[frame 7/16]
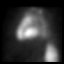
[frame 10/16]
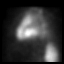
[frame 12/16]
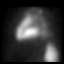
[frame 15/16]
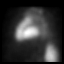

[Series 1: mu muga · 4.30mm/px · 6 of 16 frames shown (2 of 6)]
[frame 2/16]
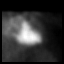
[frame 4/16]
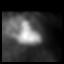
[frame 7/16]
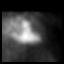
[frame 10/16]
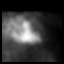
[frame 12/16]
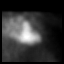
[frame 15/16]
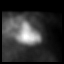

[Series 1: mu muga · 4.30mm/px · 6 of 16 frames shown (3 of 6)]
[frame 2/16]
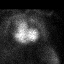
[frame 4/16]
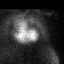
[frame 7/16]
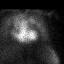
[frame 10/16]
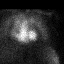
[frame 12/16]
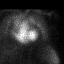
[frame 15/16]
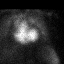

[Series 1: mu muga · 4.30mm/px · 6 of 16 frames shown (4 of 6)]
[frame 2/16]
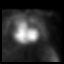
[frame 4/16]
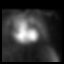
[frame 7/16]
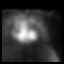
[frame 10/16]
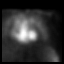
[frame 12/16]
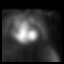
[frame 15/16]
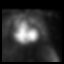

[Series 1: mu muga · 4.30mm/px · 6 of 16 frames shown (5 of 6)]
[frame 2/16]
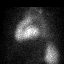
[frame 4/16]
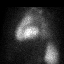
[frame 7/16]
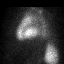
[frame 10/16]
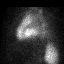
[frame 12/16]
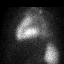
[frame 15/16]
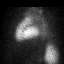

[Series 1: mu muga · 4.30mm/px · 6 of 16 frames shown (6 of 6)]
[frame 2/16]
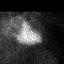
[frame 4/16]
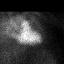
[frame 7/16]
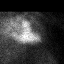
[frame 10/16]
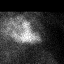
[frame 12/16]
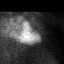
[frame 15/16]
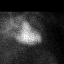

[36 of 36 positions shown; findings below may reference images not displayed]

FINDINGS: Resting left ventricular ejection fraction is estimated
at 49%.  No gross wall motion abnormality detected.
IMPRESSION: 49% ejection fraction.

## 2011-05-20 ENCOUNTER — Telehealth: Payer: Self-pay | Admitting: Oncology

## 2011-05-20 NOTE — Telephone Encounter (Signed)
pt called and r/s missed appt on 03/04/2011 to 05/26/2011

## 2011-05-26 ENCOUNTER — Other Ambulatory Visit: Payer: Self-pay | Admitting: Oncology

## 2011-05-26 ENCOUNTER — Telehealth: Payer: Self-pay | Admitting: Oncology

## 2011-05-26 ENCOUNTER — Ambulatory Visit: Payer: BC Managed Care – PPO | Admitting: Physician Assistant

## 2011-05-26 ENCOUNTER — Encounter: Payer: Self-pay | Admitting: Physician Assistant

## 2011-05-26 ENCOUNTER — Other Ambulatory Visit (HOSPITAL_BASED_OUTPATIENT_CLINIC_OR_DEPARTMENT_OTHER): Payer: BC Managed Care – PPO | Admitting: Lab

## 2011-05-26 VITALS — BP 115/72 | HR 98 | Temp 98.6°F | Ht 67.0 in | Wt 264.7 lb

## 2011-05-26 DIAGNOSIS — C50912 Malignant neoplasm of unspecified site of left female breast: Secondary | ICD-10-CM | POA: Insufficient documentation

## 2011-05-26 DIAGNOSIS — C50919 Malignant neoplasm of unspecified site of unspecified female breast: Secondary | ICD-10-CM

## 2011-05-26 DIAGNOSIS — Z170 Estrogen receptor positive status [ER+]: Secondary | ICD-10-CM

## 2011-05-26 HISTORY — DX: Malignant neoplasm of unspecified site of left female breast: C50.912

## 2011-05-26 LAB — CBC WITH DIFFERENTIAL/PLATELET
BASO%: 0.3 % (ref 0.0–2.0)
Basophils Absolute: 0 10*3/uL (ref 0.0–0.1)
EOS%: 2.4 % (ref 0.0–7.0)
Eosinophils Absolute: 0.1 10*3/uL (ref 0.0–0.5)
HCT: 40.7 % (ref 34.8–46.6)
HGB: 13.7 g/dL (ref 11.6–15.9)
LYMPH%: 30.4 % (ref 14.0–49.7)
MCH: 28.2 pg (ref 25.1–34.0)
MCHC: 33.7 g/dL (ref 31.5–36.0)
MCV: 83.8 fL (ref 79.5–101.0)
MONO#: 0.3 10*3/uL (ref 0.1–0.9)
MONO%: 6.7 % (ref 0.0–14.0)
NEUT#: 3.1 10*3/uL (ref 1.5–6.5)
NEUT%: 60.2 % (ref 38.4–76.8)
Platelets: 232 10*3/uL (ref 145–400)
RBC: 4.86 10*6/uL (ref 3.70–5.45)
RDW: 13.6 % (ref 11.2–14.5)
WBC: 5.1 10*3/uL (ref 3.9–10.3)
lymph#: 1.6 10*3/uL (ref 0.9–3.3)

## 2011-05-26 LAB — COMPREHENSIVE METABOLIC PANEL
CO2: 25 mEq/L (ref 19–32)
Calcium: 9.8 mg/dL (ref 8.4–10.5)
Chloride: 104 mEq/L (ref 96–112)
Glucose, Bld: 122 mg/dL — ABNORMAL HIGH (ref 70–99)
Sodium: 140 mEq/L (ref 135–145)
Total Bilirubin: 0.4 mg/dL (ref 0.3–1.2)
Total Protein: 7.4 g/dL (ref 6.0–8.3)

## 2011-05-26 MED ORDER — ANASTROZOLE 1 MG PO TABS: 1.0000 mg | ORAL_TABLET | Freq: Every day | ORAL | Status: AC

## 2011-05-26 NOTE — Progress Notes (Signed)
Hematology and Oncology Follow Up Visit  Michele Davis [id number] 12/22/1955 56 y.o. 05/26/2011    HPI: Towards the end of June 2010, Michele Davis noted a change in her left breast, with some nipple retraction and a little bit of pain.  She contacted her sister Michele Davis (I believe she is married to our local dentist Dr. Glean Hess) and her sister suggested she get evaluated by Dr. Nicholas Lose and under his auspices she had diagnostic mammography and left breast ultrasonography at the breast center July 13.  The breasts were dense, in the left breast there was a 7 cm spiculated mass in the upper outer portion with an apparent thickened mass in the subareolar region.  It was not clear whether these were connected or not.  There was no evidence or lymphadenopathy on either side and no evidence of malignancy on the right.  Dr. Azucena Kuba was able to palpate a large mass in the upper outer left breast and subareolar region with nipple retraction.  Ultrasound showed indeed a large irregular hypoechoic mass 8 cm from the nipple, could not be measured in its entirety because of the size.  Biopsy was obtained the same day and showed ([id number]) an invasive ductal carcinoma, which appears to be intermediate grade, 100% ER and 100% PR positive, with an MIB-1 of 45% and amplified for her to by [facility] with a ratio of 5.13.  With this information the patient was referred to Dr. Jamey Ripa and bilateral breast MRIs were obtained on July 19.  These showed the masses indeed to be confluent with a maximum diameter of 8.3 cm.  They occupy the anterior aspect of the left breast and there is some associated skin thickening.  Michele Davis was treated in the neoadjuvant setting, with 4 cycles of dose dense doxorubicin and cyclophosphamide followed by 9 weekly doses of paclitaxel and trastuzumab. Trastuzumab was continued until June of 2011, what was held secondary to a drop in the ejection fraction.  Patient underwent bilateral  mastectomies in January 2011 with left-sided sentinel lymph node sampling. Final result showed a 5 cm residual invasive ductal carcinoma, grade 2, with 2 of 4 lymph nodes involved. MIB-1 was 45%, the tumor again HER-2/neu positive with a ratio of 5.13.  Received postmastectomy radiation, completed in June of 2011, at which time she began on anastrozole, 1 mg daily.  Interim History:   Michele Davis returns today for routine followup of her left breast carcinoma. She continues on anastrozole which she is tolerating well. She and Michele Davis are still living on their boat and [place], [place]. Financially, things are still a little strained, and Michele Davis tells me she is under a lot of stress these days. She does have a history of anxiety which she feels is well-controlled with the Prozac and lorazepam. She occasionally has some difficulty sleeping, helped with Ambien.  Michele Davis is tolerating the anastrozole well, the exception of some occasional hot flashes, nothing particularly problematic. She's had no vaginal dryness. No increased joint pain. 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  A detailed review of systems is otherwise noncontributory as noted below.  Review of Systems: Constitutional:  Positive for hot flashes and anxiety, no weight loss, fever, night sweats and feels well Eyes: negative  WUJ:[id number] Cardiovascular: no chest pain or dyspnea on exertion Respiratory: no cough, shortness of breath, or wheezing Neurological: no TIA or stroke symptoms negative Dermatological: negative Gastrointestinal: no abdominal pain, change in bowel habits, or black or bloody stools Genito-Urinary:  no dysuria, trouble voiding, or hematuria Hematological and Lymphatic: negative Breast: negative Musculoskeletal: negative Remaining ROS negative.  FAMILY HISTORY:  The patient's father died at the age of 60 in the setting of prostate cancer.  The patient's mother is alive at age 24.  She  lives in [place], as does the patient's sister as already noted.  GYNECOLOGIC HISTORY:  She is GX, P2, first pregnancy to term age 55, last menstrual period July 2, her periods have always been irregular they have become a little bit heavier over the last year.  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  Her husband of 20 years Michele Davis is present today worked in Research officer, political party for many years, but now is retired.  They actually live on a sailboat currently parked in [place], [place].  They have a daughter Michele Davis.  She is 19.  They use her address as their home base address.  Their son Michele Davis lives with them.  They Ep[employer]alian, but currently not church affiliated.  Medications:   I have reviewed the patient's current medications.  Current Outpatient Prescriptions  Medication Sig Dispense Refill  . anastrozole (ARIMIDEX) 1 MG tablet Take 1 tablet (1 mg total) by mouth daily.  90 tablet  3  . cholecalciferol (VITAMIN D) 400 UNITS TABS Take 1,000 Units by mouth. 2 tabs po daily      . FLUoxetine (PROZAC) 20 MG tablet Take 20 mg by mouth daily.      Marland Kitchen LORazepam (ATIVAN) 0.5 MG tablet Take 0.5 mg by mouth every 8 (eight) hours as needed.      . Multiple Vitamin (MULTIVITAMIN) tablet Take 1 tablet by mouth daily.      Marland Kitchen zolpidem (AMBIEN) 10 MG tablet Take 10 mg by mouth at bedtime as needed.        Allergies: No Known Allergies  Physical Exam: Filed Vitals:   05/26/11 1519  BP: 115/72  Pulse: 98  Temp: 98.6 F (37 C)   HEENT:  Sclerae anicteric, conjunctivae pink.  Oropharynx clear.  No mucositis or candidiasis.   Nodes:  No cervical, supraclavicular, or axillary lymphadenopathy palpated.  Breast Exam:  Status post bilateral mastectomies. Well-healed incisions. No suspicious nodularities or skin changes. No evidence of local recurrence in the chest wall.  Lungs:  Clear to  auscultation bilaterally.  No crackles, rhonchi, or wheezes.   Heart:  Regular rate and rhythm.   Abdomen:  Soft, obese, nontender.  Positive bowel sounds.  No organomegaly or masses palpated.   Musculoskeletal:  No focal spinal tenderness to palpation.  Extremities:  Benign.  No peripheral edema or cyanosis.   Skin:  Benign.   Neuro:  Nonfocal.   Lab Results: Lab Results  Component Value Date   WBC 5.1 05/26/2011   HGB 13.7 05/26/2011   HCT 40.7 05/26/2011   MCV 83.8 05/26/2011   PLT 232 05/26/2011   NEUTROABS 3.1 05/26/2011     Chemistry      Component Value Date/Time   NA 137 09/30/2010 1006   NA 137 09/30/2010 1006   NA 137 09/30/2010 1006   K 4.0 09/30/2010 1006   K 4.0 09/30/2010 1006   K 4.0 09/30/2010 1006   CL 106 09/30/2010 1006   CL 106 09/30/2010 1006   CL 106 09/30/2010 1006   CO2 20 09/30/2010 1006   CO2 20 09/30/2010 1006   CO2 20 09/30/2010 1006  BUN 15 09/30/2010 1006   BUN 15 09/30/2010 1006   BUN 15 09/30/2010 1006   CREATININE 0.59 09/30/2010 1006   CREATININE 0.59 09/30/2010 1006   CREATININE 0.59 09/30/2010 1006      Component Value Date/Time   CALCIUM 9.3 09/30/2010 1006   CALCIUM 9.3 09/30/2010 1006   CALCIUM 9.3 09/30/2010 1006   ALKPHOS 77 09/30/2010 1006   ALKPHOS 77 09/30/2010 1006   ALKPHOS 77 09/30/2010 1006   AST 14 09/30/2010 1006   AST 14 09/30/2010 1006   AST 14 09/30/2010 1006   ALT 22 09/30/2010 1006   ALT 22 09/30/2010 1006   ALT 22 09/30/2010 1006   BILITOT 0.6 09/30/2010 1006   BILITOT 0.6 09/30/2010 1006   BILITOT 0.6 09/30/2010 1006     A CMET, CA 27. 29, vitamin D, FSH, and estradiol were drawn today and are all currently pending.   Radiological Studies:  No results found.   Assessment:  A 56 year old [place], [place], woman,  (1)  status post a left breast biopsy July of 2010 for a triple-positive invasive ductal carcinoma with skin involvement at baseline,  (2)  treated neoadjuvantly with dose-dense doxorubicin/cyclophosphamide x4,  followed by weekly paclitaxel/Herceptin x9,  (3)  the Herceptin continued to June of 2011 when it was held because of a drop in her ejection fraction which persisted.    (4)  Status post bilateral mastectomies January of 2011 with left-sided sentinel lymph node sampling , the final result showing a 5-cm residual invasive ductal carcinoma, grade 2, involving 2/4 lymph nodes sampled, with an MIB-1 of 45%. The tumor was again HER-2 positive with a ratio of 5.13/[facility].    (5)She completed radiation treatments in June of 2011 and started anastrozole at that time.    Plan:  This case was reviewed with Dr. Darnelle Catalan, and Michele Davis will continue on the anastrozole which I have refilled today. It is time to consider restaging scans. The plan had been to obtain a chest CT and bone scan in May prior to another visit with Dr. Darnelle Catalan. Michele Davis will be seeing Dr. Jamey Ripa in February, and would like to coordinate all of those appointments a little closer together to avoid multiple trips here from [place].  Accordingly, we are scheduling Michele Davis to see Dr. Jamey Ripa, have a chest CT, and a bone scan in February, followed by a subsequent appointment with Dr. Darnelle Catalan to review those results. I will also note that she will be due for her bone density test the summer, and we will schedule that when she sees [place] again in February.  This case was reviewed with the patient today who voices understanding and agreement with our plan. She will call any changes or problems.    Edouard Gikas, PA-C 05/26/2011

## 2011-05-26 NOTE — Telephone Encounter (Signed)
gve the pt her feb 2013 appt calendar along with the appt to see dr streck/ct scan/bone scan appts

## 2011-06-13 ENCOUNTER — Encounter (INDEPENDENT_AMBULATORY_CARE_PROVIDER_SITE_OTHER): Payer: Self-pay | Admitting: General Surgery

## 2011-06-17 ENCOUNTER — Encounter (HOSPITAL_COMMUNITY)
Admission: RE | Admit: 2011-06-17 | Discharge: 2011-06-17 | Disposition: A | Discharge status: Home / Self Care | Payer: BC Managed Care – PPO | Source: Ambulatory Visit | Attending: Physician Assistant | Admitting: Physician Assistant

## 2011-06-17 ENCOUNTER — Other Ambulatory Visit (HOSPITAL_COMMUNITY): Payer: Self-pay | Admitting: Endocrinology

## 2011-06-17 ENCOUNTER — Ambulatory Visit (HOSPITAL_COMMUNITY)
Admission: RE | Admit: 2011-06-17 | Discharge: 2011-06-17 | Disposition: A | Discharge status: Home / Self Care | Payer: BC Managed Care – PPO | Source: Ambulatory Visit | Attending: Physician Assistant | Admitting: Physician Assistant

## 2011-06-17 ENCOUNTER — Encounter (HOSPITAL_COMMUNITY): Payer: Self-pay

## 2011-06-17 ENCOUNTER — Ambulatory Visit (HOSPITAL_COMMUNITY)
Admission: RE | Admit: 2011-06-17 | Discharge: 2011-06-17 | DRG: 275 | Disposition: A | Discharge status: Home / Self Care | Payer: BC Managed Care – PPO | Source: Ambulatory Visit | Attending: Physician Assistant | Admitting: Physician Assistant

## 2011-06-17 ENCOUNTER — Ambulatory Visit (HOSPITAL_COMMUNITY): Payer: BC Managed Care – PPO

## 2011-06-17 DIAGNOSIS — R69 Illness, unspecified: Secondary | ICD-10-CM

## 2011-06-17 DIAGNOSIS — C50919 Malignant neoplasm of unspecified site of unspecified female breast: Secondary | ICD-10-CM | POA: Diagnosis present

## 2011-06-17 DIAGNOSIS — C50912 Malignant neoplasm of unspecified site of left female breast: Secondary | ICD-10-CM

## 2011-06-17 DIAGNOSIS — M47814 Spondylosis without myelopathy or radiculopathy, thoracic region: Secondary | ICD-10-CM | POA: Insufficient documentation

## 2011-06-17 DIAGNOSIS — Z923 Personal history of irradiation: Secondary | ICD-10-CM | POA: Insufficient documentation

## 2011-06-17 HISTORY — DX: Malignant (primary) neoplasm, unspecified: C80.1

## 2011-06-17 MED ORDER — TECHNETIUM TC 99M MEDRONATE IV KIT
25.0000 | PACK | Freq: Once | INTRAVENOUS | Status: AC | PRN
  Administered 2011-06-17: 25 via INTRAVENOUS

## 2011-06-17 MED ORDER — IOHEXOL 300 MG/ML  SOLN
80.0000 mL | Freq: Once | INTRAMUSCULAR | Status: AC | PRN
  Administered 2011-06-17: 80 mL via INTRAVENOUS

## 2011-06-18 ENCOUNTER — Encounter (INDEPENDENT_AMBULATORY_CARE_PROVIDER_SITE_OTHER): Payer: Self-pay | Admitting: Surgery

## 2011-06-18 ENCOUNTER — Ambulatory Visit (INDEPENDENT_AMBULATORY_CARE_PROVIDER_SITE_OTHER): Payer: BC Managed Care – PPO | Admitting: Surgery

## 2011-06-18 VITALS — BP 154/100 | HR 72 | Temp 97.2°F | Resp 16 | Ht 67.5 in | Wt 269.6 lb

## 2011-06-18 DIAGNOSIS — Z853 Personal history of malignant neoplasm of breast: Secondary | ICD-10-CM

## 2011-06-18 NOTE — Progress Notes (Signed)
NAME: ELVERIA LAUDERBAUGH Kawamoto       DOB: 1955-11-12           DATE: 06/18/2011       MRN: [id number]   Michele Davis is a 56 y.o.Marland Kitchenfemale who presents for routine followup of her Left breast cancer, multifocal diagnosed in 2010 and treated with neoadjuvant chemo, L MRM. She has no problems or concerns on either side.  PFSH: She has had no significant changes since the last visit here.  ROS: There have been no significant changes since the last visit here  EXAM: General: The patient is alert, oriented, generally healty appearing, NAD. Mood and affect are normal.  Breasts:  S/P bilateral mastectomy no evidence of local recurrence  Lymphatics: She has no axillary or supraclavicular adenopathy on either side.  Extremities: Full ROM of the surgical side with no lymphedema noted.  Data Reviewed: Recent chest CT and bone scan negative for recurrence  Impression: Doing well, with no evidence of recurrent cancer or new cancer  Plan: Will continue to follow up on an annual basis here.

## 2011-06-19 ENCOUNTER — Ambulatory Visit (HOSPITAL_BASED_OUTPATIENT_CLINIC_OR_DEPARTMENT_OTHER): Payer: BC Managed Care – PPO | Admitting: Oncology

## 2011-06-19 VITALS — BP 137/83 | HR 103 | Temp 98.5°F | Ht 67.5 in | Wt 268.3 lb

## 2011-06-19 DIAGNOSIS — C50912 Malignant neoplasm of unspecified site of left female breast: Secondary | ICD-10-CM

## 2011-06-19 DIAGNOSIS — C50919 Malignant neoplasm of unspecified site of unspecified female breast: Secondary | ICD-10-CM

## 2011-06-19 NOTE — Progress Notes (Signed)
ID: BRITTNAY PIGMAN  DOB: 04/11/1956  MR#: [id number]  CSN#: [id number]   Interval History:   The patient returns with her husband for followup of her breast cancer. She is doing very well overall, still "living in a boat". She is not exercising as much as she would like, walking perhaps 20 minutes a few days a week. She is tolerating the anastrozole with some vaginal dryness, which she treats with Astroglide. Hot flashes are minimal. She does not have significant arthralgias or myalgias.  ROS:  aside from anxiety, and persisting grade 1 peripheral neuropathy, a detailed review of systems today was noncontributory  No Known Allergies  Current Outpatient Prescriptions  Medication Sig Dispense Refill  . anastrozole (ARIMIDEX) 1 MG tablet Take 1 tablet (1 mg total) by mouth daily.  90 tablet  3  . cholecalciferol (VITAMIN D) 400 UNITS TABS Take 1,000 Units by mouth. 2 tabs po daily      . FLUoxetine (PROZAC) 20 MG tablet Take 20 mg by mouth daily.      Marland Kitchen LORazepam (ATIVAN) 0.5 MG tablet Take 0.5 mg by mouth every 8 (eight) hours as needed.      . Multiple Vitamin (MULTIVITAMIN) tablet Take 1 tablet by mouth daily.      Marland Kitchen zolpidem (AMBIEN) 10 MG tablet Take 10 mg by mouth at bedtime as needed.       PAST MEDICAL HISTORY:  The patient has a history of well compensated hyperthyroidism, on Synthroid, history of tonsillectomy and adenoidectomy and a history of C-section.  FAMILY HISTORY:  The patient's father died at the age of 61 in the setting of prostate cancer.  The patient's mother is alive at age 79.  She lives in [place], as does the patient's sister as already noted.  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  Her husband of 20 years Rosanne Ashing is present today worked in Research [employer], political party for many years, but now is retired.  They actually live on  a sailboat currently parked in [place], [place].  They have a daughter Ladona Ridgel attending [address] in [place].  She is 19.  They use her address as their home base address.  Their son Sheria Lang lives with them.  They Ep[employer]alian, but currently not church affiliated.  Objective:  Filed Vitals:   06/19/11 1012  BP: 137/83  Pulse: 103  Temp: 98.5 F (36.9 C)    BMI: Body mass index is 41.40 kg/(m^2).   ECOG FS: 0  Physical Exam:   Sclerae unicteric  Oropharynx clear  No peripheral adenopathy  Lungs clear -- no rales or rhonchi  Heart regular rate and rhythm  Abdomen benign  MSK no focal spinal tenderness, no peripheral edema  Neuro nonfocal  Breast exam: Status post bilateral mastectomies. No evidence of local recurrence.   Lab Results:      Chemistry      Component Value Date/Time   NA 140 05/26/2011 1500   K 3.7 05/26/2011 1500   CL 104 05/26/2011 1500   CO2 25 05/26/2011 1500   BUN 12 05/26/2011 1500   CREATININE 0.67 05/26/2011 1500      Component Value Date/Time   CALCIUM 9.8 05/26/2011 1500   ALKPHOS 90 05/26/2011 1500   AST 15 05/26/2011 1500   ALT 23 05/26/2011 1500   BILITOT 0.4 05/26/2011 1500  Lab Results  Component Value Date   WBC 5.1 05/26/2011   HGB 13.7 05/26/2011   HCT 40.7 05/26/2011   MCV 83.8 05/26/2011   PLT 232 05/26/2011   NEUTROABS 3.1 05/26/2011    Studies/Results:  Ct Chest W Contrast  06/17/2011  *RADIOLOGY REPORT*  Clinical Data: Breast cancer.  Restaging.  CT CHEST WITH CONTRAST  Technique:  Multidetector CT imaging of the chest was performed following the standard protocol during bolus administration of intravenous contrast.  Contrast: 80mL OMNIPAQUE IOHEXOL 300 MG/ML IV SOLN  Comparison: CT chest 12/05/2008.  Findings: There are surgical changes from bilateral mastectomies. No chest wall mass, supraclavicular or axillary lymphadenopathy. The bony thorax is intact.  No destructive bony lesions or spinal canal  compromise.  Stable degenerative changes involving the thoracic spine.  The heart is normal in size.  No pericardial effusion.  No mediastinal or hilar lymphadenopathy.  Small scattered lymph nodes are noted.  The esophagus is grossly normal.  The aorta is normal in caliber.  No dissection.  Examination of the lung parenchyma demonstrates radiation fibrosis type changes involving the left lung anteriorly.  No worrisome pulmonary lesions to suggest pulmonary metastatic disease.  No pleural effusions.  The tracheobronchial tree is grossly normal.  The upper abdomen demonstrates a fatty liver but no focal lesions.  IMPRESSION:  1.  No CT findings for recurrent or metastatic disease involving the chest. 2.  Surgical changes from bilateral mastectomies. 3.  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  Original Report Authenticated By: P. Loralie Champagne, M.D.   Nm Bone Scan Whole Body  06/17/2011  *RADIOLOGY REPORT*  Clinical Data: Breast cancer.  Right  traumatic knee injury  NUCLEAR MEDICINE WHOLE BODY BONE SCINTIGRAPHY  Technique:  Whole body anterior and posterior images were obtained approximately 3 hours after intravenous injection of radiopharmaceutical.  Radiopharmaceutical: 26.6 mCi technetium 99 MDP  Comparison: CT 06/16/2037  Findings: No abnormal uptake within the axillary or appendicular to skeleton suggest metastasis.  Uptake within the left mandible is likely odontogenic.  Uptake within the right patella is post traumatic.  IMPRESSION: 1. No scintigraphic evidence skeletal metastasis.  2. Uptake within the left mandible is likely odontogenic.  Original Report Authenticated By: Genevive Bi, M.D.    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  Beth underwent bilateral mastectomies January of 2011 with left-sided sentinel lymph node sampling , the final result showing a 5-cm residual invasive ductal carcinoma, grade 2, involving 2/4 lymph nodes sampled, with an MIB-1 of 45%.  The tumor was again HER-2 positive with a ratio of 5.13/CISH.  She completed radiation treatments in June of 2011 and started anastrozole at that time.    Plan: It is very gratifying that there is no evidence of disease recurrence. She is going to see [place] on a every 6 month basis for the next 3 years. I have asked her to schedule a bone density with whoever her primary care physician is going to be at the [facility]. When we get to the 5 years of anastrozole we will decide whether to continue an additional 5 years or not. She knows to call for any problems that  may develop before the next visit.     Gwenda Heiner C 06/19/2011

## 2011-12-16 ENCOUNTER — Other Ambulatory Visit: Payer: BC Managed Care – PPO | Admitting: Lab

## 2011-12-16 ENCOUNTER — Other Ambulatory Visit: Payer: Self-pay | Admitting: *Deleted

## 2011-12-16 DIAGNOSIS — C50912 Malignant neoplasm of unspecified site of left female breast: Secondary | ICD-10-CM

## 2011-12-23 ENCOUNTER — Ambulatory Visit: Payer: BC Managed Care – PPO | Admitting: Oncology

## 2012-05-28 ENCOUNTER — Encounter (INDEPENDENT_AMBULATORY_CARE_PROVIDER_SITE_OTHER): Payer: Self-pay | Admitting: Surgery

## 2015-07-31 ENCOUNTER — Encounter: Payer: Self-pay | Admitting: Oncology

## 2015-07-31 NOTE — Progress Notes (Signed)
left for dr. Jana Hakim to sign-out till 08/01/15

## 2015-07-31 NOTE — Progress Notes (Signed)
forms left in box-fmla

## 2015-08-01 ENCOUNTER — Encounter: Payer: Self-pay | Admitting: Oncology

## 2015-08-01 NOTE — Progress Notes (Signed)
faxed to  [phone] and mailed copy to patient and copy to medical records.

## 2015-08-02 ENCOUNTER — Encounter: Payer: Self-pay | Admitting: Oncology

## 2015-08-02 NOTE — Progress Notes (Signed)
fmla forms were filled out for her in error. It was for another patient. She is faxing form to me again and I will complete for correct patient and get back to her today.

## 2019-10-14 ENCOUNTER — Telehealth: Payer: Self-pay | Admitting: Oncology

## 2019-10-14 NOTE — Telephone Encounter (Signed)
Faxed medical records to [facility] at [phone], Release [id number]
